# Patient Record
Sex: Male | Born: 2005 | Race: White | Hispanic: Yes | Marital: Single | Smoking: Never smoker
Health system: Southern US, Community
[De-identification: ages and names within clinical notes are randomized; demographics above are authoritative.]

## PROBLEM LIST (undated history)

## (undated) DIAGNOSIS — Z973 Presence of spectacles and contact lenses: Secondary | ICD-10-CM

## (undated) DIAGNOSIS — Z9229 Personal history of other drug therapy: Secondary | ICD-10-CM

## (undated) DIAGNOSIS — S82892A Other fracture of left lower leg, initial encounter for closed fracture: Secondary | ICD-10-CM

## (undated) HISTORY — PX: NO PAST SURGERIES: SHX2092

---
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
## 2023-03-17 ENCOUNTER — Ambulatory Visit
Admission: EM | Admit: 2023-03-17 | Discharge: 2023-03-17 | Disposition: A | Discharge status: Home / Self Care | Payer: Medicaid Other | Attending: Family Medicine | Admitting: Family Medicine

## 2023-03-17 DIAGNOSIS — H6691 Otitis media, unspecified, right ear: Secondary | ICD-10-CM | POA: Diagnosis not present

## 2023-03-17 DIAGNOSIS — H60501 Unspecified acute noninfective otitis externa, right ear: Secondary | ICD-10-CM

## 2023-03-17 MED ORDER — NEOMYCIN-POLYMYXIN-HC 3.5-10000-1 OT SUSP
4.0000 [drp] | Freq: Three times a day (TID) | OTIC | 0 refills | Status: AC
Start: 2023-03-17 — End: 2023-03-24

## 2023-03-17 MED ORDER — AMOXICILLIN 875 MG PO TABS
875.0000 mg | ORAL_TABLET | Freq: Two times a day (BID) | ORAL | 0 refills | Status: AC
Start: 2023-03-17 — End: 2023-03-27

## 2023-03-17 NOTE — ED Triage Notes (Signed)
Pt presents with rt ear pain and fullness x 1 wk. Pt states he has not been sick recently.

## 2023-03-17 NOTE — ED Provider Notes (Signed)
[facility]    CSN: [id number] Arrival date & time: 03/17/23  1140      History   Chief Complaint Chief Complaint  Patient presents with   Ear Pain    HPI Joshua Short is a 18 y.o. male presents for ear pain.  Patient reports 1 week of right ear pain/fullness with muffled hearing.  Denies any ear drainage, fevers, chills, URI symptoms.  No recent swimming or excessive water in his ear.  Denies history of ear infections as an adult.  No OTC medications have been used since onset.  No other concerns at this time.  HPI  Past Medical History:  Diagnosis Date   Closed left ankle fracture    Immunizations up to date    Wears glasses     There are no active problems to display for this patient.   Past Surgical History:  Procedure Laterality Date   NO PAST SURGERIES     ORIF ANKLE FRACTURE Left 02/08/2019   Procedure: OPEN REDUCTION INTERNAL FIXATION LEFT  ANKLE FRACTURE BIMALLEOAR SYNDESMOSIS;  Surgeon: Bjorn Pippin, MD;  Location: [facility] [place];  Service: Orthopedics;  Laterality: Left;       Home Medications    Prior to Admission medications   Medication Sig Start Date End Date Taking? Authorizing Provider  amoxicillin (AMOXIL) 875 MG tablet Take 1 tablet (875 mg total) by mouth 2 (two) times daily for 10 days. 03/17/23 03/27/23 Yes Radford Pax, NP  neomycin-polymyxin-hydrocortisone (CORTISPORIN) 3.5-10000-1 OTIC suspension Place 4 drops into the right ear 3 (three) times daily for 7 days. 03/17/23 03/24/23 Yes Radford Pax, NP  lidocaine (XYLOCAINE) 2 % solution Use as directed 15 mLs in the mouth or throat every 6 (six) hours as needed (Sore throat). Gargle and spit do not swallow 08/19/22   Radford Pax, NP    Family History History reviewed. No pertinent family history.  Social History Social History   Tobacco Use   Smoking status: Never   Smokeless tobacco: Never  Vaping Use   Vaping status: Never Used  Substance Use Topics    Alcohol use: Never   Drug use: Never     Allergies   Patient has no known allergies.   Review of Systems Review of Systems  HENT:  Positive for ear pain.      Physical Exam Triage Vital Signs ED Triage Vitals  Encounter Vitals Group     BP 03/17/23 1221 (!) 151/108     Systolic BP Percentile --      Diastolic BP Percentile --      Pulse Rate 03/17/23 1221 76     Resp 03/17/23 1221 16     Temp 03/17/23 1221 98.3 F (36.8 C)     Temp Source 03/17/23 1221 Oral     SpO2 03/17/23 1221 97 %     Weight --      Height --      Head Circumference --      Peak Flow --      Pain Score 03/17/23 1220 6     Pain Loc --      Pain Education --      Exclude from Growth Chart --    No data found.  Updated Vital Signs BP (!) 151/108 (BP Location: Left Arm)   Pulse 76   Temp 98.3 F (36.8 C) (Oral)   Resp 16   SpO2 97%   Visual Acuity Right Eye Distance:  Left Eye Distance:   Bilateral Distance:    Right Eye Near:   Left Eye Near:    Bilateral Near:     Physical Exam Vitals and nursing note reviewed.  Constitutional:      General: He is not in acute distress.    Appearance: Normal appearance. He is not ill-appearing.  HENT:     Head: Normocephalic and atraumatic.     Right Ear: Swelling present. No drainage. Tympanic membrane is erythematous.     Left Ear: Tympanic membrane and ear canal normal.  Eyes:     Pupils: Pupils are equal, round, and reactive to light.  Cardiovascular:     Rate and Rhythm: Normal rate.  Pulmonary:     Effort: Pulmonary effort is normal.  Skin:    General: Skin is warm and dry.  Neurological:     General: No focal deficit present.     Mental Status: He is alert and oriented to person, place, and time.  Psychiatric:        Mood and Affect: Mood normal.        Behavior: Behavior normal.      UC Treatments / Results  Labs (all labs ordered are listed, but only abnormal results are displayed) Labs Reviewed - No data to  display  EKG   Radiology No results found.  Procedures Procedures (including critical care time)  Medications Ordered in UC Medications - No data to display  Initial Impression / Assessment and Plan / UC Course  I have reviewed the triage vital signs and the nursing notes.  Pertinent labs & imaging results that were available during my care of the patient were reviewed by me and considered in my medical decision making (see chart for details).     Reviewed exam and symptoms with patient.  No red flags.  Start amoxicillin and Cortisporin drops as prescribed.  Instructed to keep water out of the ear until treatment is complete.  PCP follow-up if symptoms do not improve.  ER precautions reviewed. Final Clinical Impressions(s) / UC Diagnoses   Final diagnoses:  Acute otitis externa of right ear, unspecified type  Right otitis media, unspecified otitis media type     Discharge Instructions      Start amoxicillin twice daily for 10 days to treat your eardrum infection and start Cortisporin eardrops 3 times a day for 1 week to treat your ear canal infection.  Keep water out of your ear until you are done with the treatment.  You may take Tylenol or ibuprofen as needed for pain.  Follow-up with your PCP if your symptoms do not improve.  Please go to the ER if you develop any worsening symptoms.  Hope you feel better soon!    ED Prescriptions     Medication Sig Dispense Auth. Provider   amoxicillin (AMOXIL) 875 MG tablet Take 1 tablet (875 mg total) by mouth 2 (two) times daily for 10 days. 20 tablet Radford Pax, NP   neomycin-polymyxin-hydrocortisone (CORTISPORIN) 3.5-10000-1 OTIC suspension Place 4 drops into the right ear 3 (three) times daily for 7 days. 10 mL Radford Pax, NP      PDMP not reviewed this encounter.   Radford Pax, NP 03/17/23 1233

## 2023-03-17 NOTE — Discharge Instructions (Signed)
Start amoxicillin twice daily for 10 days to treat your eardrum infection and start Cortisporin eardrops 3 times a day for 1 week to treat your ear canal infection.  Keep water out of your ear until you are done with the treatment.  You may take Tylenol or ibuprofen as needed for pain.  Follow-up with your PCP if your symptoms do not improve.  Please go to the ER if you develop any worsening symptoms.  Hope you feel better soon!

## 2023-05-11 ENCOUNTER — Ambulatory Visit
Admission: EM | Admit: 2023-05-11 | Discharge: 2023-05-11 | Disposition: A | Discharge status: Home / Self Care | Attending: Family Medicine | Admitting: Family Medicine

## 2023-05-11 DIAGNOSIS — H60501 Unspecified acute noninfective otitis externa, right ear: Secondary | ICD-10-CM

## 2023-05-11 DIAGNOSIS — H6691 Otitis media, unspecified, right ear: Secondary | ICD-10-CM

## 2023-05-11 MED ORDER — AMOXICILLIN 875 MG PO TABS
875.0000 mg | ORAL_TABLET | Freq: Two times a day (BID) | ORAL | 0 refills | Status: AC
Start: 2023-05-11 — End: 2023-05-21

## 2023-05-11 MED ORDER — NEOMYCIN-POLYMYXIN-HC 3.5-10000-1 OT SUSP
4.0000 [drp] | Freq: Three times a day (TID) | OTIC | 0 refills | Status: AC
Start: 2023-05-11 — End: 2023-05-18

## 2023-05-11 NOTE — Discharge Instructions (Signed)
 Start amoxicillin twice daily for 10 days.  Start Cortisporin antibiotic eardrops 3 times a day for 7 days.  Please keep water out of that right ear until you are done with treatment.  You may use Tylenol or ibuprofen over-the-counter as needed for ear pain.  Lots of rest and fluids.  Please follow-up with your PCP if your symptoms do not improve.  If symptoms continue to recur advise you follow-up with ear nose and throat.  Please go to the ER for any worsening symptoms.  I hope you feel better soon!

## 2023-05-11 NOTE — ED Provider Notes (Signed)
 [facility]    CSN: [id number] Arrival date & time: 05/11/23  1233      History   Chief Complaint Chief Complaint  Patient presents with   Ear Pain    HPI Joshua Short is a 18 y.o. male presents for ear pain.  Patient reports yesterday he began developing right ear pain/fullness.  Denies any drainage, fevers or chills, URI symptoms.  Does use Q-tips to clean his ears.  No recent swimming or excessive water in the ear.  He was seen in urgent care in February for same complaint and was treated for both otitis media and otitis externa with relief of symptoms.  States the symptoms are the same.  He has not taken any OTC medications for symptoms.  No other concerns at this time.  HPI  Past Medical History:  Diagnosis Date   Closed left ankle fracture    Immunizations up to date    Wears glasses     There are no active problems to display for this patient.   Past Surgical History:  Procedure Laterality Date   NO PAST SURGERIES     ORIF ANKLE FRACTURE Left 02/08/2019   Procedure: OPEN REDUCTION INTERNAL FIXATION LEFT  ANKLE FRACTURE BIMALLEOAR SYNDESMOSIS;  Surgeon: Bjorn Pippin, MD;  Location: [facility];  Service: Orthopedics;  Laterality: Left;       Home Medications    Prior to Admission medications   Medication Sig Start Date End Date Taking? Authorizing Provider  amoxicillin (AMOXIL) 875 MG tablet Take 1 tablet (875 mg total) by mouth 2 (two) times daily for 10 days. 05/11/23 05/21/23 Yes Radford Pax, NP  neomycin-polymyxin-hydrocortisone (CORTISPORIN) 3.5-10000-1 OTIC suspension Place 4 drops into the right ear 3 (three) times daily for 7 days. 05/11/23 05/18/23 Yes Radford Pax, NP  lidocaine (XYLOCAINE) 2 % solution Use as directed 15 mLs in the mouth or throat every 6 (six) hours as needed (Sore throat). Gargle and spit do not swallow 08/19/22   Radford Pax, NP    Family History History reviewed. No pertinent family  history.  Social History Social History   Tobacco Use   Smoking status: Never   Smokeless tobacco: Never  Vaping Use   Vaping status: Never Used  Substance Use Topics   Alcohol use: Never   Drug use: Never     Allergies   Patient has no known allergies.   Review of Systems Review of Systems  HENT:  Positive for ear pain.      Physical Exam Triage Vital Signs ED Triage Vitals  Encounter Vitals Group     BP 05/11/23 1259 122/84     Systolic BP Percentile --      Diastolic BP Percentile --      Pulse Rate 05/11/23 1259 73     Resp 05/11/23 1301 16     Temp 05/11/23 1259 98.9 F (37.2 C)     Temp Source 05/11/23 1259 Oral     SpO2 05/11/23 1259 97 %     Weight --      Height --      Head Circumference --      Peak Flow --      Pain Score 05/11/23 1257 4     Pain Loc --      Pain Education --      Exclude from Growth Chart --    No data found.  Updated Vital Signs BP 122/84 (BP Location: Right  Arm)   Pulse 73   Temp 98.9 F (37.2 C) (Oral)   Resp 16   SpO2 97%   Visual Acuity Right Eye Distance:   Left Eye Distance:   Bilateral Distance:    Right Eye Near:   Left Eye Near:    Bilateral Near:     Physical Exam Vitals and nursing note reviewed.  Constitutional:      General: He is not in acute distress.    Appearance: Normal appearance. He is obese. He is not ill-appearing.  HENT:     Head: Normocephalic and atraumatic.     Right Ear: Swelling present. There is no impacted cerumen. No mastoid tenderness. Tympanic membrane is erythematous. Tympanic membrane is not perforated.     Left Ear: Tympanic membrane and ear canal normal.     Ears:     Comments: Mild swelling of right canal with erythema.  No active drainage. Eyes:     Pupils: Pupils are equal, round, and reactive to light.  Cardiovascular:     Rate and Rhythm: Normal rate.  Pulmonary:     Effort: Pulmonary effort is normal.  Skin:    General: Skin is warm and dry.  Neurological:      General: No focal deficit present.     Mental Status: He is alert and oriented to person, place, and time.  Psychiatric:        Mood and Affect: Mood normal.        Behavior: Behavior normal.      UC Treatments / Results  Labs (all labs ordered are listed, but only abnormal results are displayed) Labs Reviewed - No data to display  EKG   Radiology No results found.  Procedures Procedures (including critical care time)  Medications Ordered in UC Medications - No data to display  Initial Impression / Assessment and Plan / UC Course  I have reviewed the triage vital signs and the nursing notes.  Pertinent labs & imaging results that were available during my care of the patient were reviewed by me and considered in my medical decision making (see chart for details).     Reviewed exam and symptoms with patient.  No red flags.  Will start amoxicillin for otitis media and Cortisporin for otitis externa.  Patient to keep water out of the ear until treatment is complete.  OTC analgesics as needed.  PCP follow-up if symptoms do not improve.  Advised he will need ENT evaluation if symptoms recur.  ER precautions reviewed and patient verbalized understanding. Final Clinical Impressions(s) / UC Diagnoses   Final diagnoses:  Acute otitis externa of right ear, unspecified type  Right otitis media, unspecified otitis media type     Discharge Instructions      Start amoxicillin twice daily for 10 days.  Start Cortisporin antibiotic eardrops 3 times a day for 7 days.  Please keep water out of that right ear until you are done with treatment.  You may use Tylenol or ibuprofen over-the-counter as needed for ear pain.  Lots of rest and fluids.  Please follow-up with your PCP if your symptoms do not improve.  If symptoms continue to recur advise you follow-up with ear nose and throat.  Please go to the ER for any worsening symptoms.  I hope you feel better soon!    ED Prescriptions      Medication Sig Dispense Auth. Provider   neomycin-polymyxin-hydrocortisone (CORTISPORIN) 3.5-10000-1 OTIC suspension Place 4 drops into the right ear 3 (three)  times daily for 7 days. 10 mL Radford Pax, NP   amoxicillin (AMOXIL) 875 MG tablet Take 1 tablet (875 mg total) by mouth 2 (two) times daily for 10 days. 20 tablet Radford Pax, NP      PDMP not reviewed this encounter.   Radford Pax, NP 05/11/23 1326

## 2023-05-11 NOTE — ED Triage Notes (Signed)
 Pt presents with c/o rt ear pain and fullness since yesterday. Has not taken anything at home for relief. Pt states he was seen here for the sxs before and felt better, States the sxs came back.

## 2024-02-23 ENCOUNTER — Ambulatory Visit: Admitting: Family Medicine

## 2024-04-12 ENCOUNTER — Ambulatory Visit: Admitting: Family Medicine
